# Patient Record
Sex: Female | Born: 1985 | Hispanic: Yes | Marital: Single
Health system: Southern US, Community
[De-identification: ages and names within clinical notes are randomized; demographics above are authoritative.]

---

## 2019-06-18 ENCOUNTER — Ambulatory Visit: Payer: Self-pay | Attending: Internal Medicine

## 2019-06-18 DIAGNOSIS — Z23 Encounter for immunization: Secondary | ICD-10-CM

## 2019-06-18 NOTE — Progress Notes (Signed)
   Covid-[facility] Vaccination [facility]  Name:  Phyllis Sanchez    MRN: [id number] DOB: [birth year]  06/18/2019  [name] was observed post Covid-19 immunization for 15 minutes without incident. She was provided with Vaccine Information Sheet and instruction to access the V-Safe system.   [name] was instructed to call 911 with any severe reactions post vaccine: Marland Kitchen Difficulty breathing  . Swelling of face and throat  . A fast heartbeat  . A bad rash all over body  . Dizziness and weakness   Immunizations Administered    Name Date Dose VIS Date Route   Pfizer COVID-19 Vaccine 06/18/2019  9:06 AM 0.3 mL 03/24/2019 Intramuscular   Manufacturer: [employer], [employer]   Lot: [id number]   NDC: [address]-9

## 2019-07-09 ENCOUNTER — Ambulatory Visit: Payer: Self-pay | Attending: Internal Medicine

## 2019-07-09 DIAGNOSIS — Z23 Encounter for immunization: Secondary | ICD-10-CM

## 2019-07-09 NOTE — Progress Notes (Signed)
   Covid-19 Vaccination Clinic  Name:  Phyllis Sanchez    MRN: [id number] DOB: 1985/07/01  07/09/2019  Ms. Phyllis Sanchez was observed post Covid-19 immunization for 15 minutes without incident. She was provided with Vaccine Information Sheet and instruction to access the V-Safe system.   Ms. Phyllis Sanchez was instructed to call 911 with any severe reactions post vaccine: Marland Kitchen Difficulty breathing  . Swelling of face and throat  . A fast heartbeat  . A bad rash all over body  . Dizziness and weakness   Immunizations Administered    Name Date Dose VIS Date Route   Pfizer COVID-19 Vaccine 07/09/2019  8:44 AM 0.3 mL 03/24/2019 Intramuscular   Manufacturer: [employer], [employer]   Lot: [id number]   NDC: [address]-7

## 2021-04-02 ENCOUNTER — Other Ambulatory Visit: Payer: Self-pay | Admitting: Nurse Practitioner

## 2021-04-02 DIAGNOSIS — M25552 Pain in left hip: Secondary | ICD-10-CM

## 2021-04-02 DIAGNOSIS — G8929 Other chronic pain: Secondary | ICD-10-CM

## 2021-04-27 ENCOUNTER — Ambulatory Visit
Admission: RE | Admit: 2021-04-27 | Discharge: 2021-04-27 | Disposition: A | Discharge status: Home / Self Care | Payer: 59 | Source: Ambulatory Visit | Attending: Nurse Practitioner | Admitting: Nurse Practitioner

## 2021-04-27 ENCOUNTER — Other Ambulatory Visit: Payer: Self-pay

## 2021-04-27 DIAGNOSIS — M545 Low back pain, unspecified: Secondary | ICD-10-CM

## 2021-04-27 DIAGNOSIS — G8929 Other chronic pain: Secondary | ICD-10-CM

## 2021-04-27 DIAGNOSIS — M25552 Pain in left hip: Secondary | ICD-10-CM

## 2021-04-27 IMAGING — MR MR HIP*L* W/O CM
4 of 5 series · 29 of 40 positions shown · non-contrast
Comparison: None.

CLINICAL DATA: Lower back pain and left hip pain. Numbness
radiating to the left hip and leg down to the foot. One year.

EXAM:
MR OF THE LEFT HIP WITHOUT CONTRAST
TECHNIQUE: Multiplanar, multisequence MR imaging was performed. No intravenous
contrast was administered.

[Series 3: T1 · coronal · 4.5mm · 1.19mm/px · 8 of 26 slices shown]
[im 1/26]
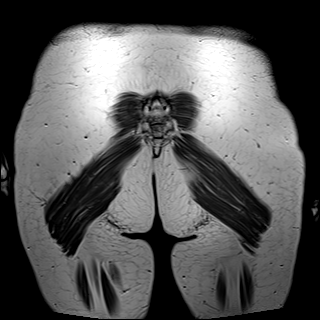
[im 4/26]
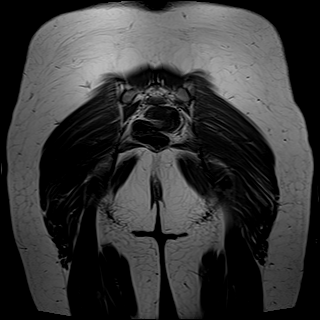
[im 8/26]
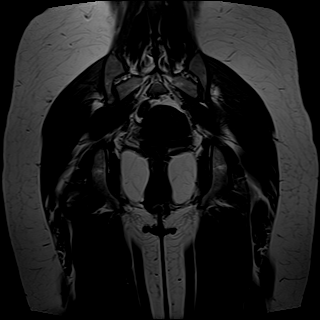
[im 11/26]
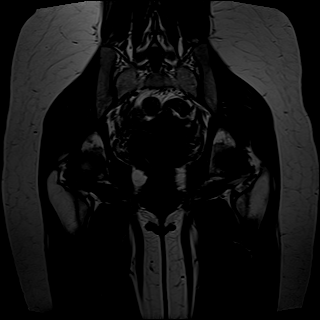
[im 15/26]
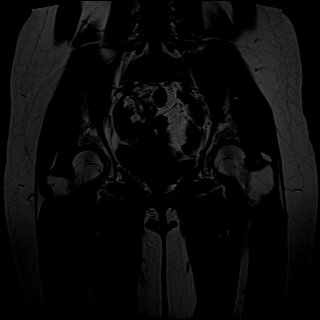
[im 18/26]
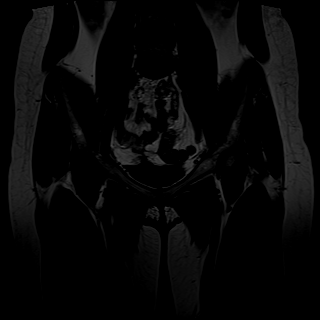
[im 22/26]
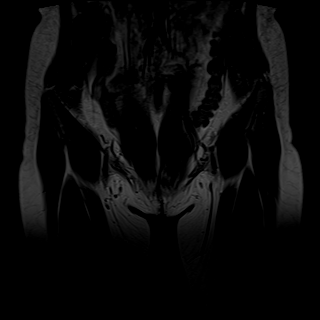
[im 26/26]
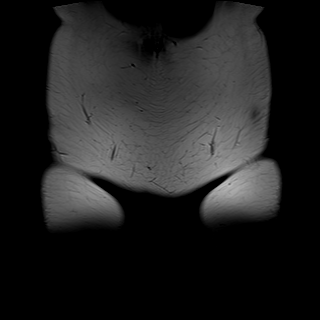

[Series 4: T2 fat-sat · coronal · 4.5mm · 1.19mm/px · 8 of 26 slices shown (1 of 2)]
[im 1/26]
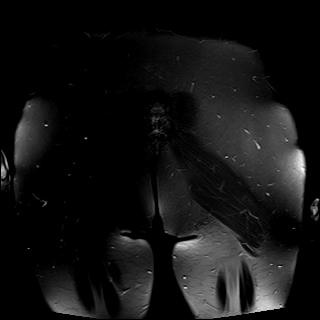
[im 4/26]
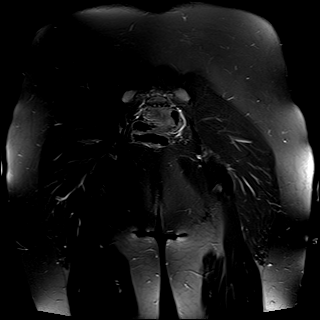
[im 8/26]
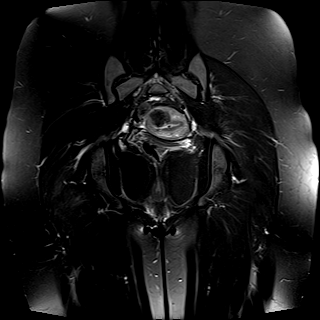
[im 11/26]
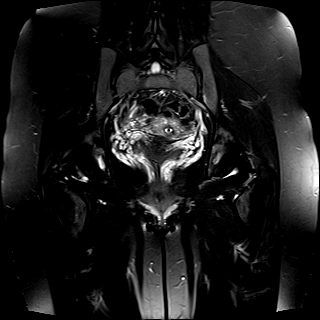
[im 15/26]
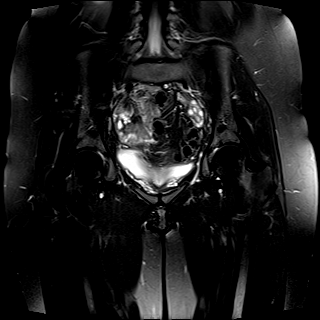
[im 18/26]
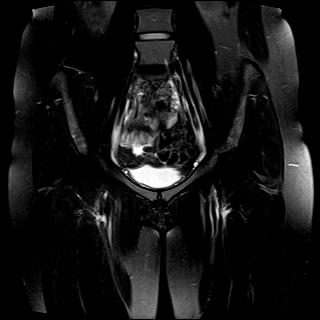
[im 22/26]
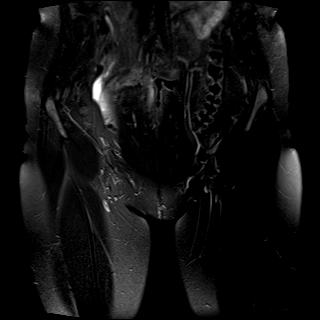
[im 26/26]
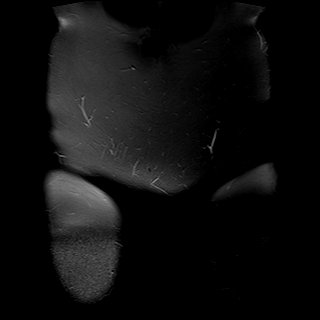

[Series 5: T2 fat-sat · axial · 4.5mm · 0.62mm/px · z∈[-166,-9]mm · 9 of 30 slices shown (2 of 2)]
[im 1/30]
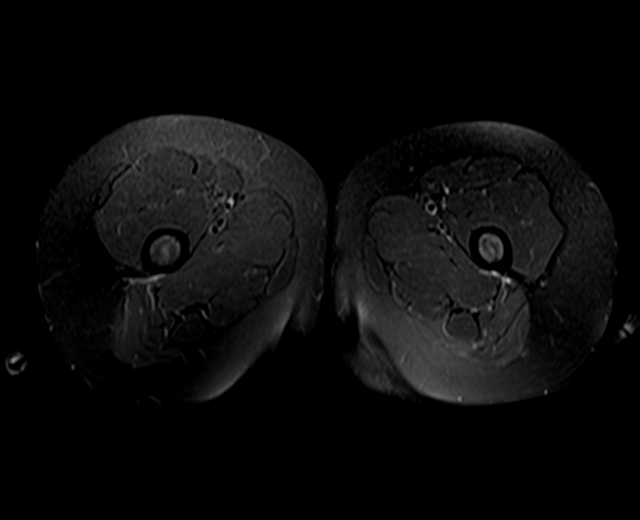
[im 4/30]
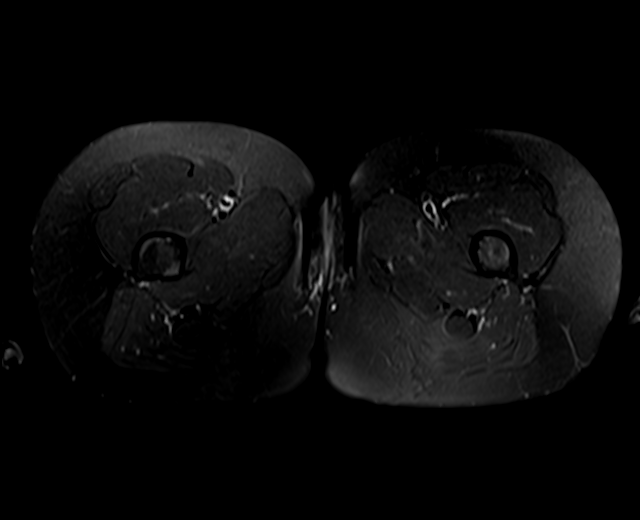
[im 8/30]
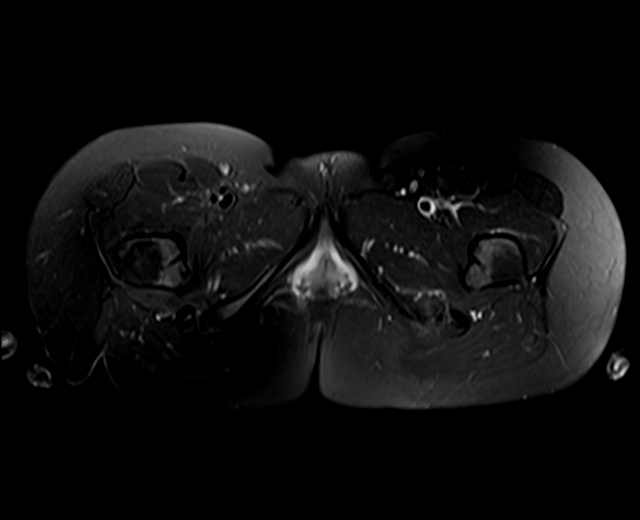
[im 11/30]
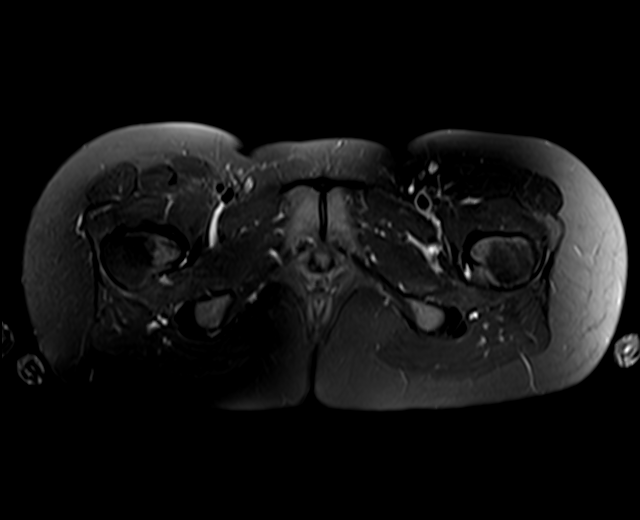
[im 15/30]
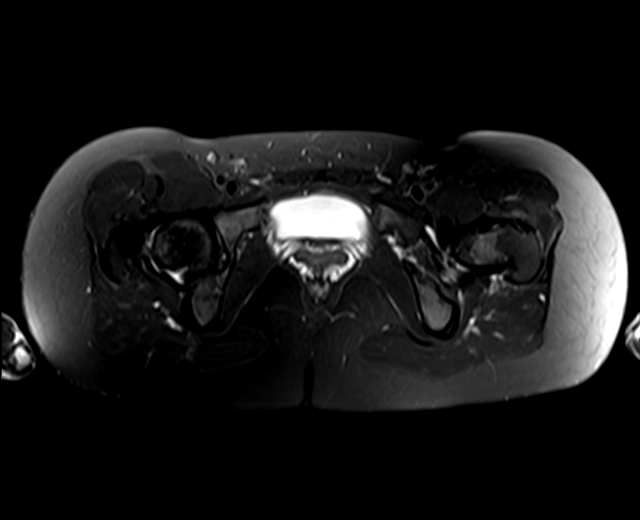
[im 19/30]
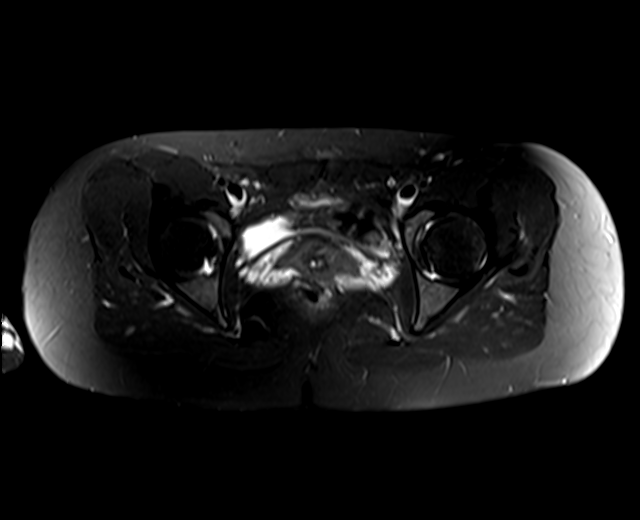
[im 22/30]
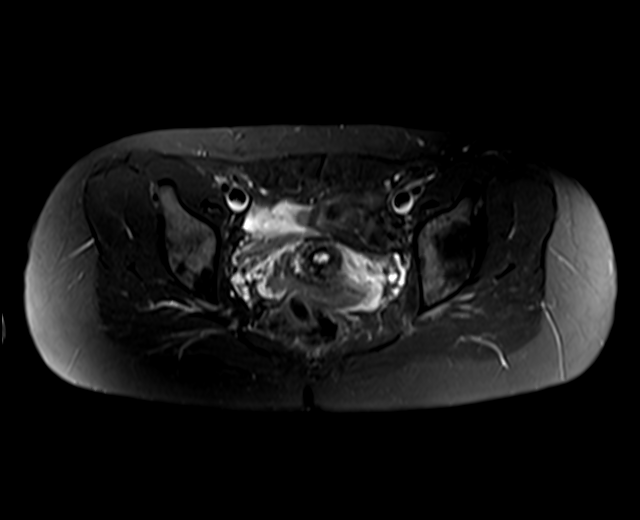
[im 26/30]
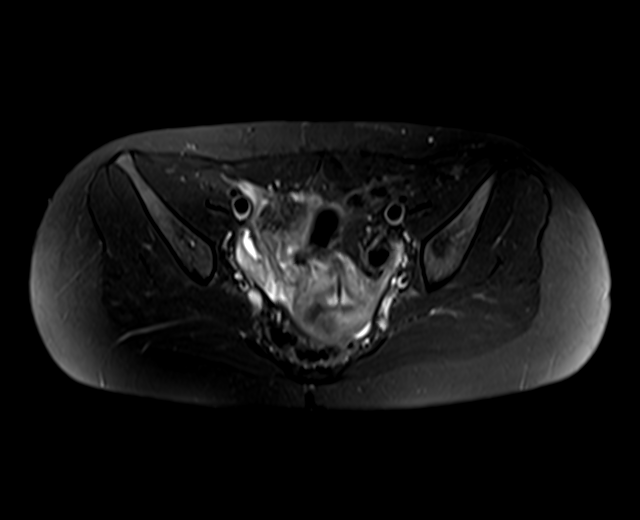
[im 30/30]
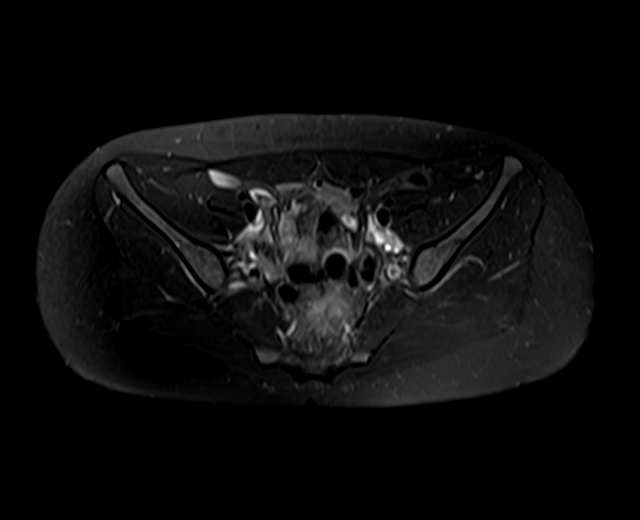

[Series 6: PD fat-sat · sagittal · 4.0mm · 0.74mm/px · 4 of 26 slices shown]
[im 1/26]
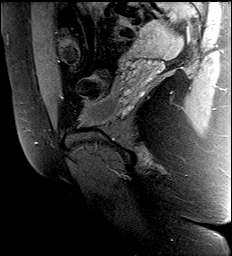
[im 4/26]
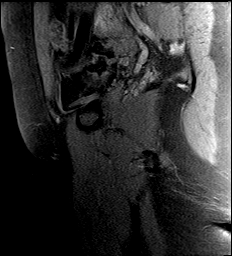
[im 15/26]
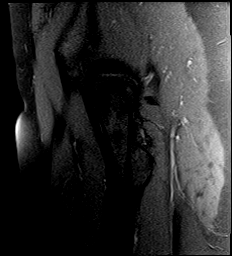
[im 22/26]
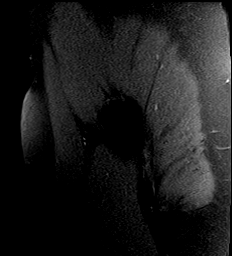

[29 of 40 positions shown; findings below may reference images not displayed]

FINDINGS: Bones: Normal marrow signal.

Articular cartilage and labrum

Articular cartilage: On coned-down images, the left femoral head and
acetabular cartilage is intact. Normal morphology of the left
femoral head-neck junction without cam type bump deformity.

Labrum: Lack of intra-articular fluid limits evaluation of the left
acetabular labrum. Within this limitation, there is linear increased
proton density signal within the superior left acetabular labrum
(coronal series 7 images 12 through 16), suspicious for a tear.

The right hip is included on large field-of-view images and grossly
unremarkable.

Joint or bursal effusion

Joint effusion:  No joint effusion in either hip.

Bursae: No trochanteric bursitis.

Muscles and tendons

Muscles and tendons: The origins of the bilateral sartorius, rectus
femoris, and common hamstring tendons are intact. The insertions of
the bilateral iliopsoas and gluteus medius tendons are intact.
Minimal edema is seen around the bilateral gluteus minimus tendon
insertions.

The rectus abdominus-adductor aponeuroses are intact.

Other findings

Miscellaneous: Incidental incidental note is made of an IUD within
the endometrial canal, appearing in appropriate position. Within the
posterosuperior right aspect of the uterine fundus there is a
decreased T2 signal likely fibroid measuring up to approximately
cm.
IMPRESSION: :
IMPRESSION: 1. No significant degenerative change either hip.
2. Within limitations of lack of intra-articular fluid, there is
increased linear signal suspicious for a superior left acetabular
labral tear. If clinically indicated, this may be further evaluated
with MR arthrogram of the left hip.
3. Minimal edema around the bilateral gluteus minimus tendon
insertions. No significant tendinosis.
4. Fibroid measuring up to 2.8 cm within the uterine fundus.

## 2021-04-27 IMAGING — MR MR LUMBAR SPINE W/O CM
4 of 5 series · 26 of 48 positions shown · non-contrast
Comparison: None.

CLINICAL DATA: Lower back and left hip pain with left leg pain and
numbness.

EXAM:
MRI LUMBAR SPINE WITHOUT CONTRAST
TECHNIQUE: Multiplanar, multisequence MR imaging of the lumbar spine was
performed. No intravenous contrast was administered.

[Series 3: T2 · sagittal · 4.0mm · 1.09mm/px · 6 of 15 slices shown (1 of 2)]
[im 1/15]
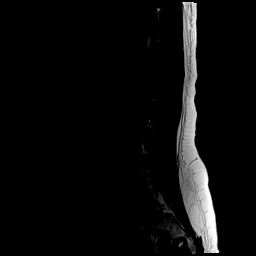
[im 3/15]
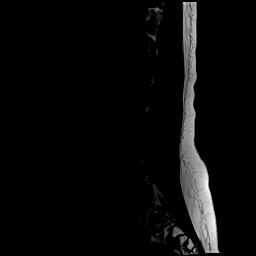
[im 6/15]
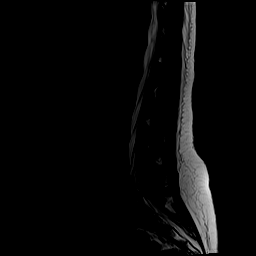
[im 9/15]
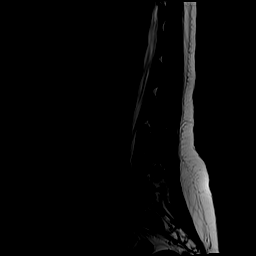
[im 12/15]
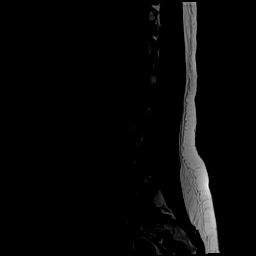
[im 15/15]
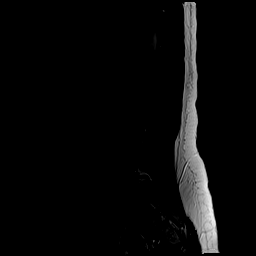

[Series 5: T1 · sagittal · 4.0mm · 1.09mm/px · 5 of 15 slices shown (1 of 2)]
[im 1/15]
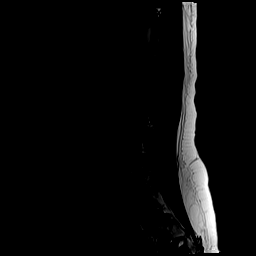
[im 4/15]
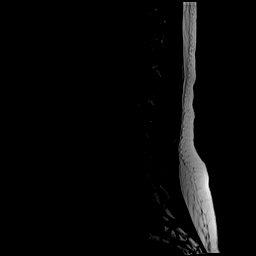
[im 8/15]
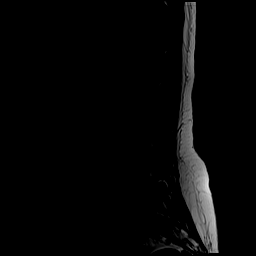
[im 11/15]
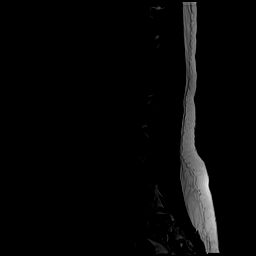
[im 15/15]
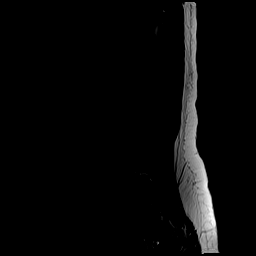

[Series 6: T2 · axial · 4.0mm · 0.39mm/px · z∈[-86,+131]mm · 10 of 43 slices shown (2 of 2)]
[im 3/43]
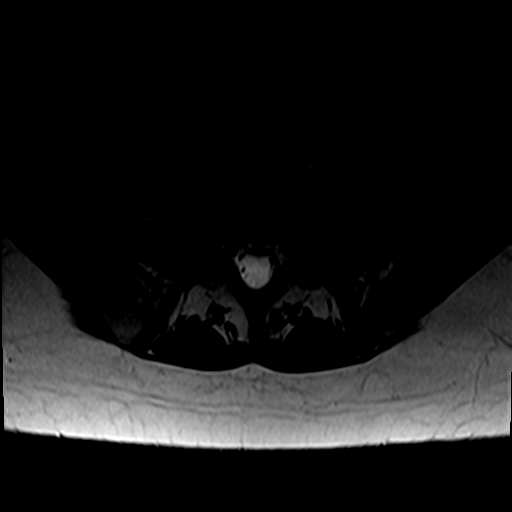
[im 6/43]
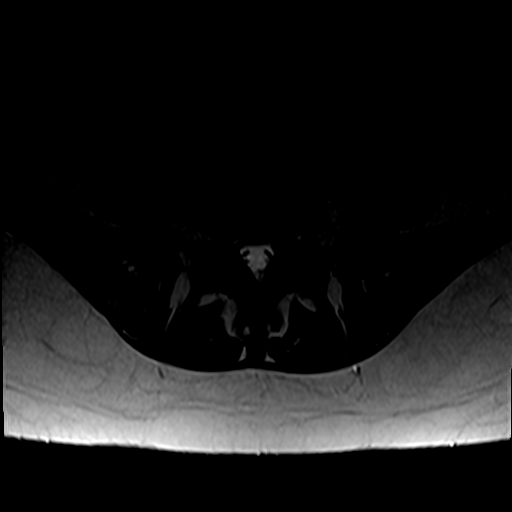
[im 9/43]
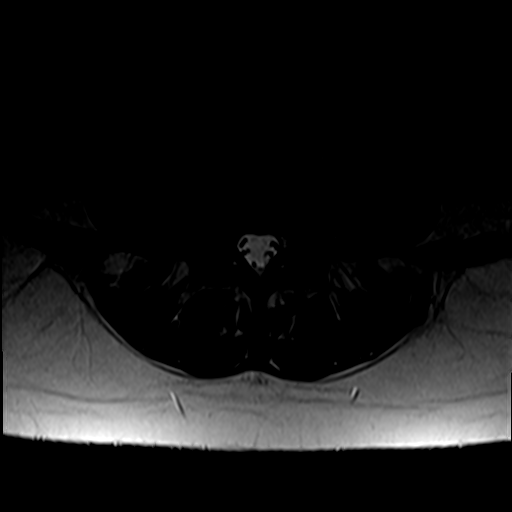
[im 15/43]
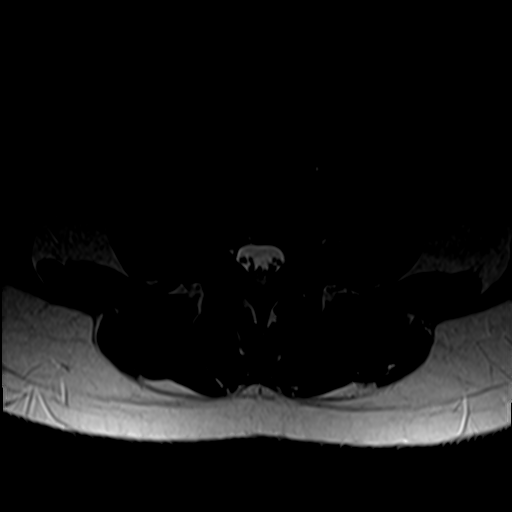
[im 20/43]
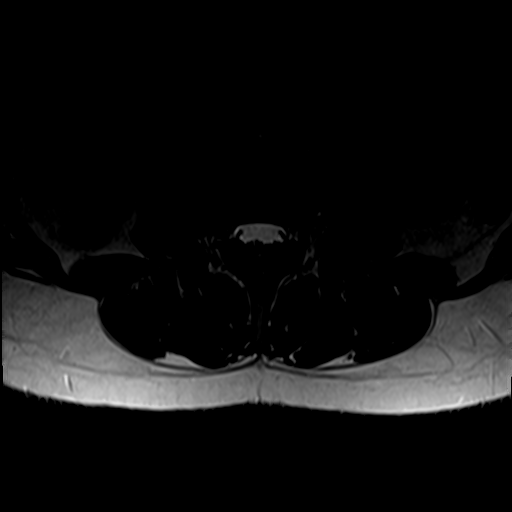
[im 23/43]
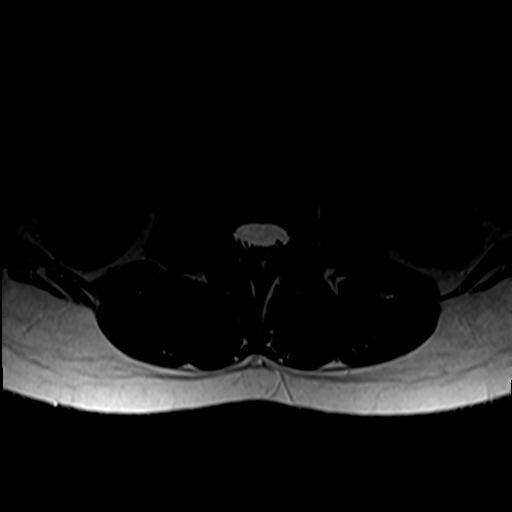
[im 26/43]
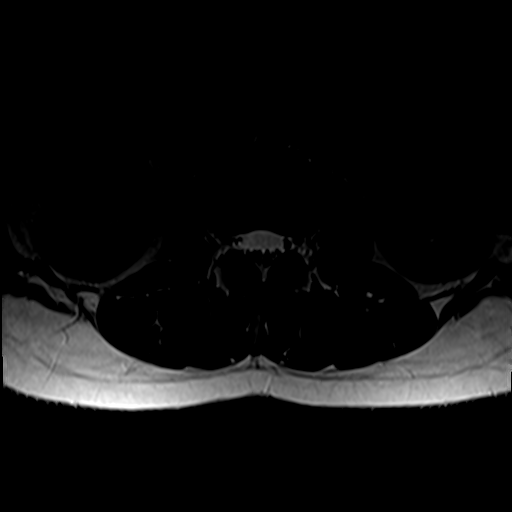
[im 31/43]
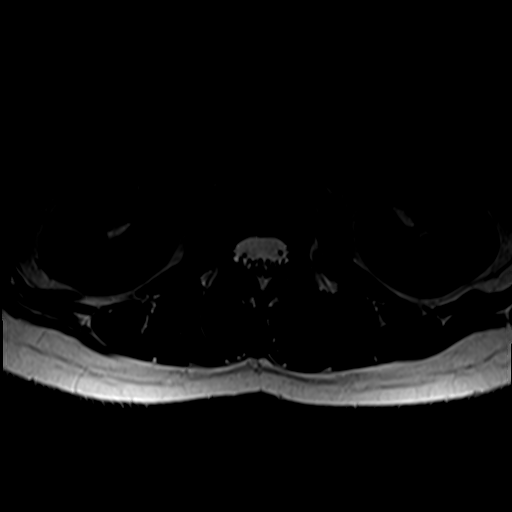
[im 37/43]
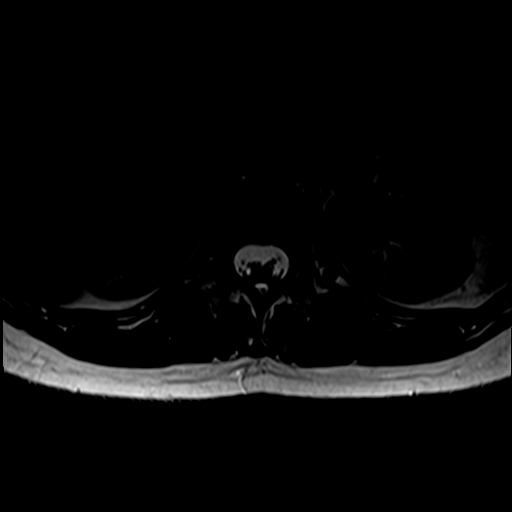
[im 43/43]
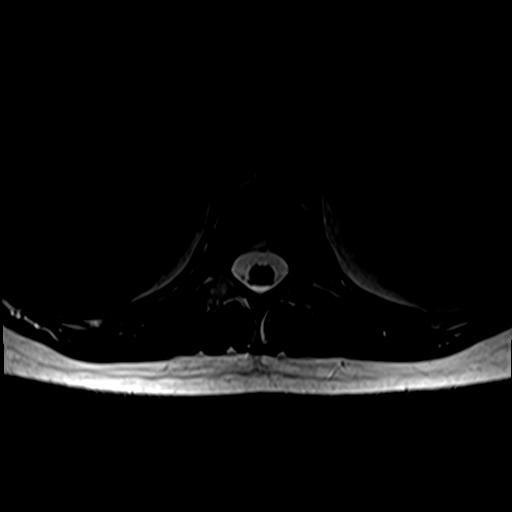

[Series 7: T1 · axial · 4.0mm · 0.39mm/px · z∈[-86,+102]mm · 5 of 43 slices shown (2 of 2)]
[im 3/43]
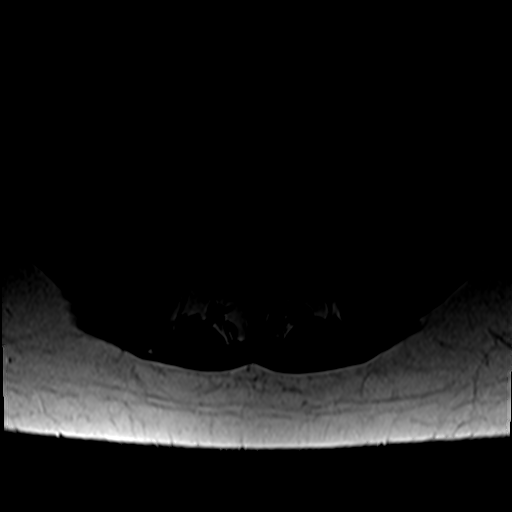
[im 6/43]
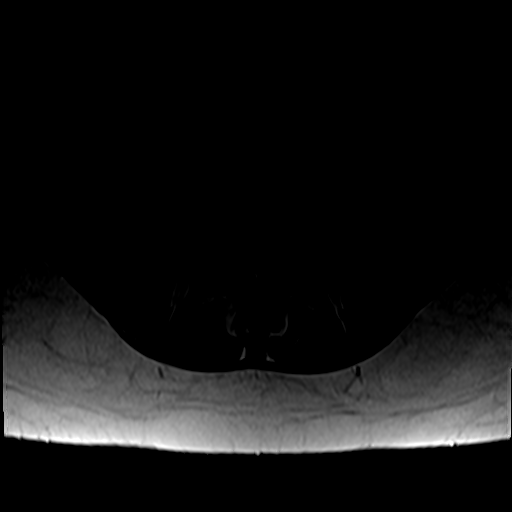
[im 9/43]
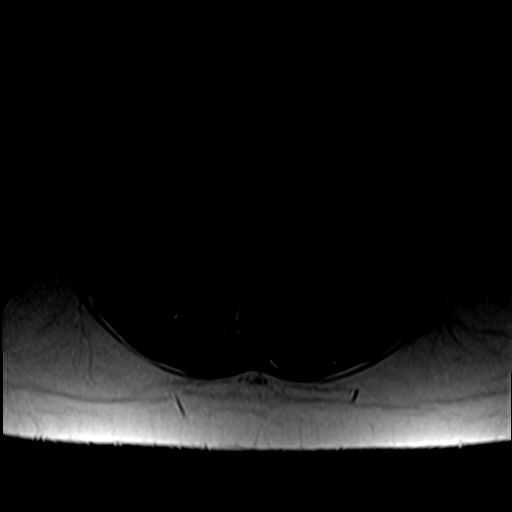
[im 23/43]
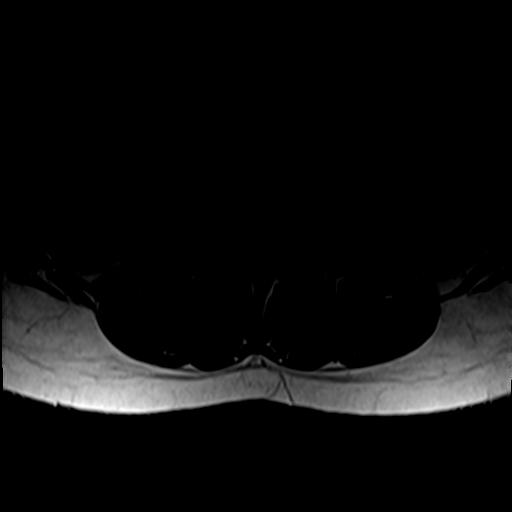
[im 37/43]
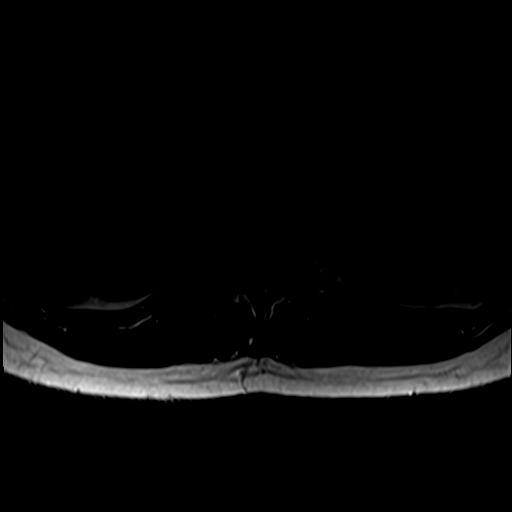

[26 of 48 positions shown; findings below may reference images not displayed]

FINDINGS: Segmentation:  Standard.

Alignment:  There is 2 mm retrolisthesis of L5 on S1.

Vertebrae: Vertebral body heights are maintained. Normal marrow
signal.

Conus medullaris and cauda equina: Conus extends to the inferior L1
level. Conus and cauda equina appear normal.

Paraspinal and other soft tissues: Limited images of the
retroperitoneum are unremarkable.

Disc levels:

L1-2: No posterior disc bulge, central canal narrowing, or neural
foraminal stenosis.

L2-3: Mild bilateral facet joint hypertrophy. No posterior disc
bulge, central canal narrowing, or neural foraminal stenosis.

L3-4: Mild bilateral facet joint hypertrophy. No posterior disc
bulge, central canal narrowing, or neural foraminal stenosis.

L4-5: Mild-to-moderate bilateral facet joint hypertrophy. No
posterior disc bulge. No central canal or neural foraminal stenosis.

L5-S1: Mild-to-moderate bilateral facet joint hypertrophy.
Mild-to-moderate broad-based posterior disc bulge is slightly
greater on the right measuring up to 6 mm in AP dimension. Mild mass
effect on the descending right S1 nerve within the right lateral
recess and minimal mass effect on the descending left S1 nerve
within the left lateral recess. Moderate narrowing of the lateral
recesses. No significant central canal stenosis. Minimal extension
of disc into the inferior aspect of the bilateral neural foramina
but no significant neural foraminal stenosis.
IMPRESSION: :
IMPRESSION: L5-S1 mild-to-moderate posterior disc bulge with mild mass effect on
the descending right greater than left S1 nerves within the lateral
recesses. Moderate narrowing of the lateral recesses without central
canal stenosis.

## 2024-04-03 ENCOUNTER — Other Ambulatory Visit: Payer: Self-pay

## 2024-04-03 DIAGNOSIS — K529 Noninfective gastroenteritis and colitis, unspecified: Secondary | ICD-10-CM

## 2024-04-11 ENCOUNTER — Ambulatory Visit
Admission: RE | Admit: 2024-04-11 | Discharge: 2024-04-11 | Disposition: A | Discharge status: Home / Self Care | Payer: Self-pay | Source: Ambulatory Visit

## 2024-04-11 DIAGNOSIS — K529 Noninfective gastroenteritis and colitis, unspecified: Secondary | ICD-10-CM

## 2024-04-11 MED ADMIN — Iopamidol IV Soln 61%: 100 mL | INTRAVENOUS | NDC 00270131535

## 2024-07-04 ENCOUNTER — Ambulatory Visit: Admitting: Physical Therapy
